# Patient Record
Sex: Male | Born: 1995 | Race: White | Hispanic: No | Marital: Single | Smoking: Never smoker
Health system: Southern US, Community
[De-identification: ages and names within clinical notes are randomized; demographics above are authoritative.]

---
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
## 2017-02-02 DIAGNOSIS — R7989 Other specified abnormal findings of blood chemistry: Secondary | ICD-10-CM | POA: Diagnosis not present

## 2017-02-02 DIAGNOSIS — R791 Abnormal coagulation profile: Secondary | ICD-10-CM | POA: Diagnosis not present

## 2017-02-02 DIAGNOSIS — R945 Abnormal results of liver function studies: Secondary | ICD-10-CM | POA: Diagnosis not present

## 2017-02-02 DIAGNOSIS — K130 Diseases of lips: Secondary | ICD-10-CM | POA: Diagnosis not present

## 2017-02-02 DIAGNOSIS — D649 Anemia, unspecified: Secondary | ICD-10-CM | POA: Diagnosis not present

## 2017-06-02 DIAGNOSIS — B349 Viral infection, unspecified: Secondary | ICD-10-CM | POA: Diagnosis not present

## 2018-06-01 DIAGNOSIS — N5089 Other specified disorders of the male genital organs: Secondary | ICD-10-CM | POA: Diagnosis not present

## 2018-06-01 DIAGNOSIS — N50819 Testicular pain, unspecified: Secondary | ICD-10-CM | POA: Diagnosis not present

## 2018-06-01 DIAGNOSIS — I861 Scrotal varices: Secondary | ICD-10-CM | POA: Diagnosis not present

## 2018-06-15 DIAGNOSIS — N509 Disorder of male genital organs, unspecified: Secondary | ICD-10-CM | POA: Diagnosis not present

## 2018-07-04 DIAGNOSIS — R1031 Right lower quadrant pain: Secondary | ICD-10-CM | POA: Diagnosis not present

## 2018-07-09 ENCOUNTER — Other Ambulatory Visit: Payer: Self-pay | Admitting: Surgery

## 2018-07-09 DIAGNOSIS — R1031 Right lower quadrant pain: Secondary | ICD-10-CM

## 2018-07-25 ENCOUNTER — Ambulatory Visit
Admission: RE | Admit: 2018-07-25 | Discharge: 2018-07-25 | Disposition: A | Discharge status: Home / Self Care | Payer: BC Managed Care – PPO | Source: Ambulatory Visit | Attending: Surgery | Admitting: Surgery

## 2018-07-25 ENCOUNTER — Telehealth: Payer: Self-pay | Admitting: Surgery

## 2018-07-25 DIAGNOSIS — M899 Disorder of bone, unspecified: Secondary | ICD-10-CM | POA: Diagnosis not present

## 2018-07-25 DIAGNOSIS — R1030 Lower abdominal pain, unspecified: Secondary | ICD-10-CM | POA: Diagnosis not present

## 2018-07-25 DIAGNOSIS — R1031 Right lower quadrant pain: Secondary | ICD-10-CM

## 2018-07-25 MED ORDER — IOPAMIDOL (ISOVUE-300) INJECTION 61%
100.0000 mL | Freq: Once | INTRAVENOUS | Status: AC | PRN
  Administered 2018-07-25: 100 mL via INTRAVENOUS

## 2018-07-25 NOTE — Telephone Encounter (Signed)
Called patient to go over CT results. No inguinal hernia. No findings to explain the pain he'd had. He reports the pain is now resolved. He is still concerned about the asymmetry of his testicles/ scrotum. I advised him to follow up with his urologist about this.

## 2018-08-24 DIAGNOSIS — N509 Disorder of male genital organs, unspecified: Secondary | ICD-10-CM | POA: Diagnosis not present

## 2019-07-05 DIAGNOSIS — N509 Disorder of male genital organs, unspecified: Secondary | ICD-10-CM | POA: Diagnosis not present

## 2020-01-06 DIAGNOSIS — B349 Viral infection, unspecified: Secondary | ICD-10-CM | POA: Diagnosis not present

## 2020-01-07 DIAGNOSIS — Z20822 Contact with and (suspected) exposure to covid-19: Secondary | ICD-10-CM | POA: Diagnosis not present

## 2020-01-07 DIAGNOSIS — Z03818 Encounter for observation for suspected exposure to other biological agents ruled out: Secondary | ICD-10-CM | POA: Diagnosis not present

## 2020-03-30 DIAGNOSIS — M6281 Muscle weakness (generalized): Secondary | ICD-10-CM | POA: Diagnosis not present

## 2020-03-30 DIAGNOSIS — M542 Cervicalgia: Secondary | ICD-10-CM | POA: Diagnosis not present

## 2020-04-22 DIAGNOSIS — M6281 Muscle weakness (generalized): Secondary | ICD-10-CM | POA: Diagnosis not present

## 2020-04-22 DIAGNOSIS — M542 Cervicalgia: Secondary | ICD-10-CM | POA: Diagnosis not present

## 2020-07-20 DIAGNOSIS — L219 Seborrheic dermatitis, unspecified: Secondary | ICD-10-CM | POA: Diagnosis not present

## 2020-07-20 DIAGNOSIS — F5101 Primary insomnia: Secondary | ICD-10-CM | POA: Diagnosis not present

## 2021-03-16 IMAGING — CT CT PELVIS WITH CONTRAST
1 series · 15 of 32 positions shown, 19 images · IV contrast (APPLIED)
Comparison: None.

CLINICAL DATA: Right groin and medial thigh pain for 2 months. Dull
testicular pain with asymmetric testicles. No acute injury, prior
relevant surgery or history of malignancy.

EXAM:
CT PELVIS WITH CONTRAST
TECHNIQUE: Multidetector CT imaging of the pelvis was performed using the
standard protocol following the bolus administration of intravenous
contrast.
CONTRAST:  100mL UGY1AX-800 IOPAMIDOL (UGY1AX-800) INJECTION 61%

[Series 2: routine pelvis w/cm · axial · 0.72mm/px · z∈[-362,-82]mm · 15 of 63 slices shown, 19 images]
[im 5/63  soft-tissue]
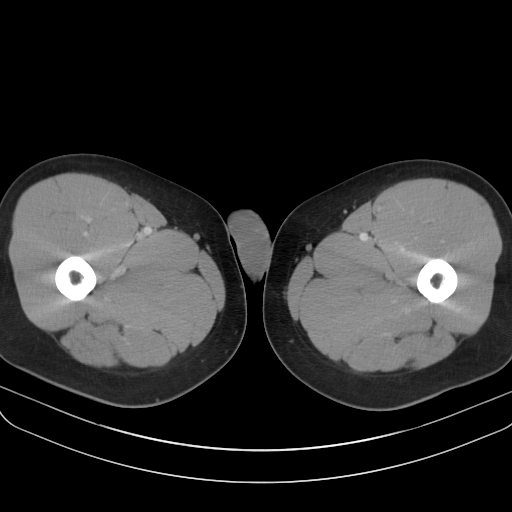
[im 5/63  bone]
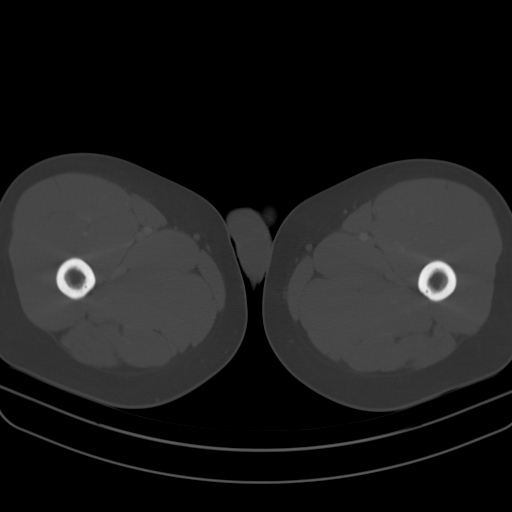
[im 9/63  soft-tissue]
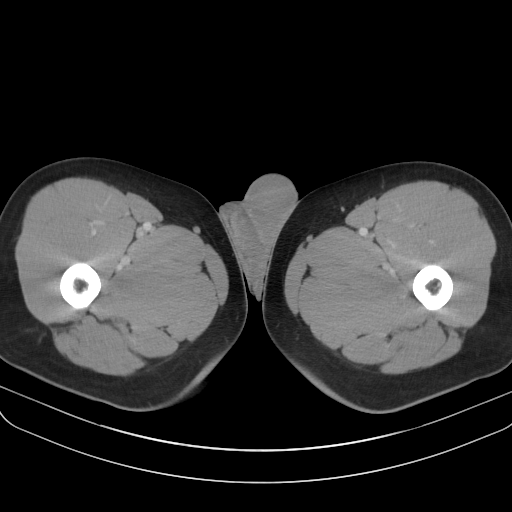
[im 13/63  soft-tissue]
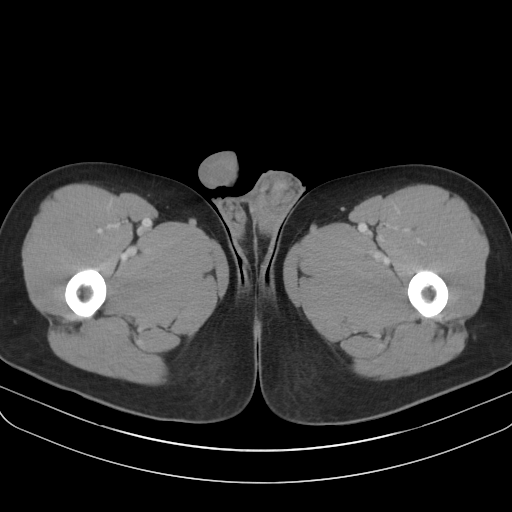
[im 19/63  soft-tissue]
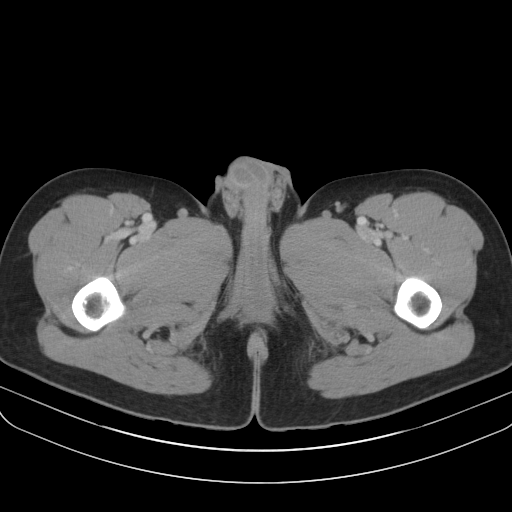
[im 23/63  soft-tissue]
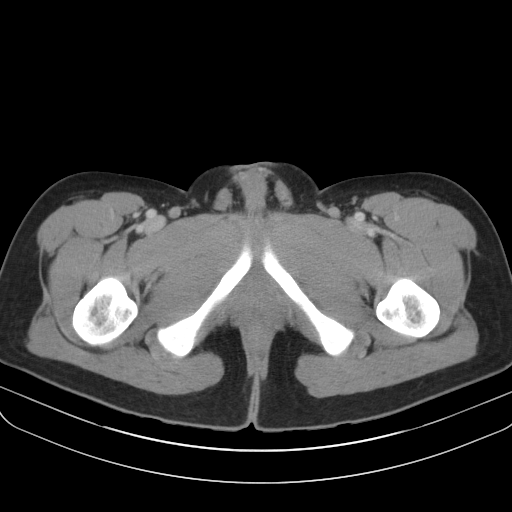
[im 27/63  soft-tissue]
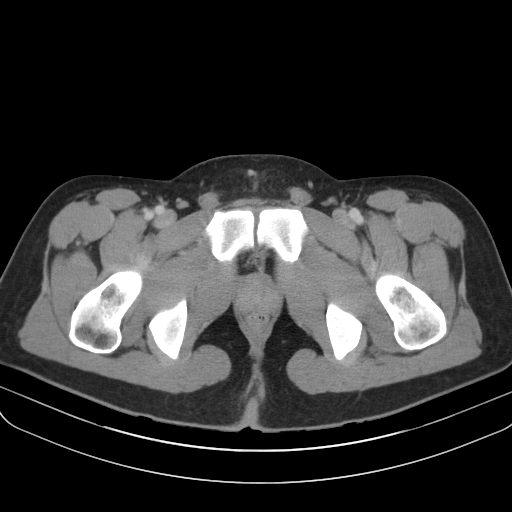
[im 33/63  soft-tissue]
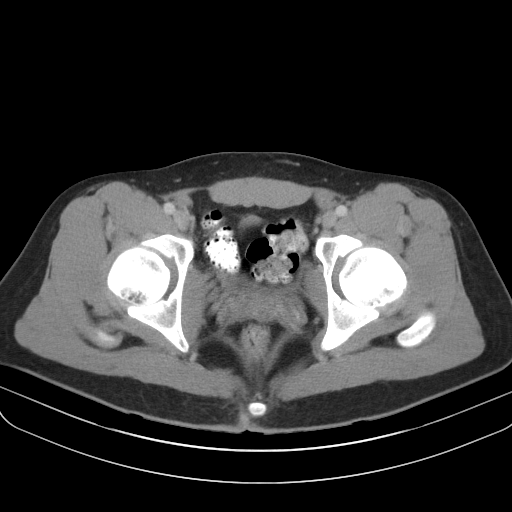
[im 37/63  soft-tissue]
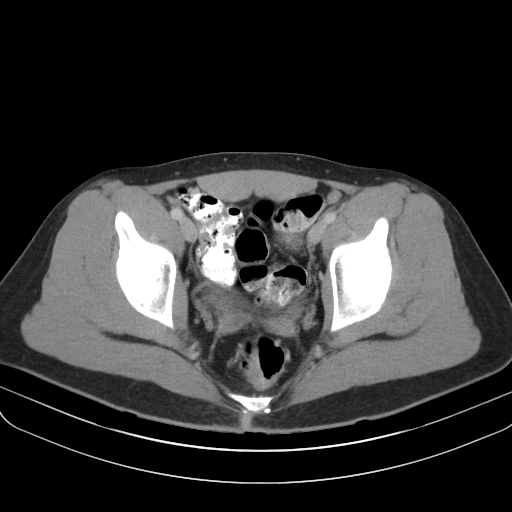
[im 41/63  soft-tissue]
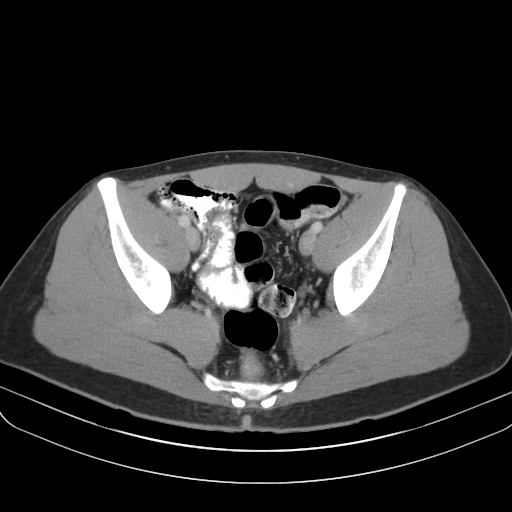
[im 41/63  bone]
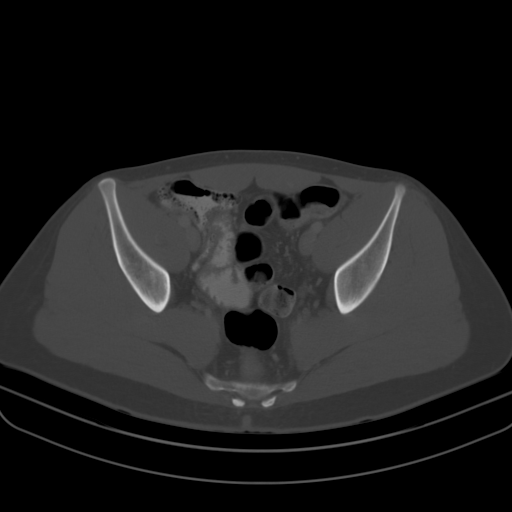
[im 45/63  soft-tissue]
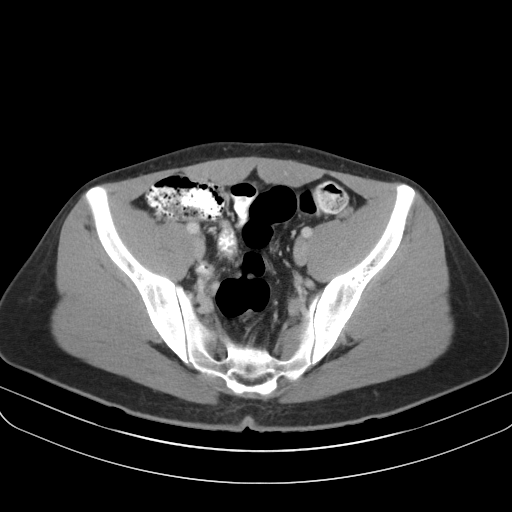
[im 51/63  soft-tissue]
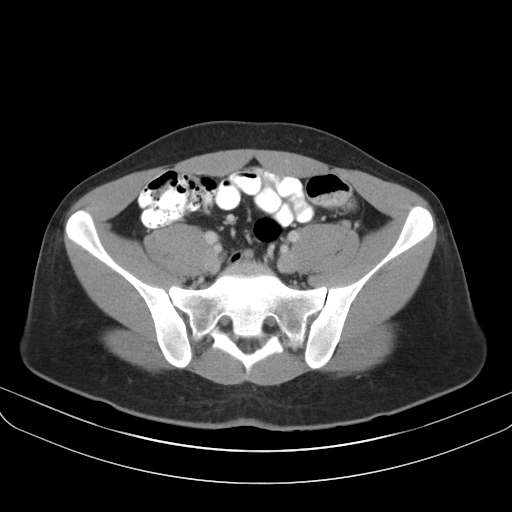
[im 55/63  soft-tissue]
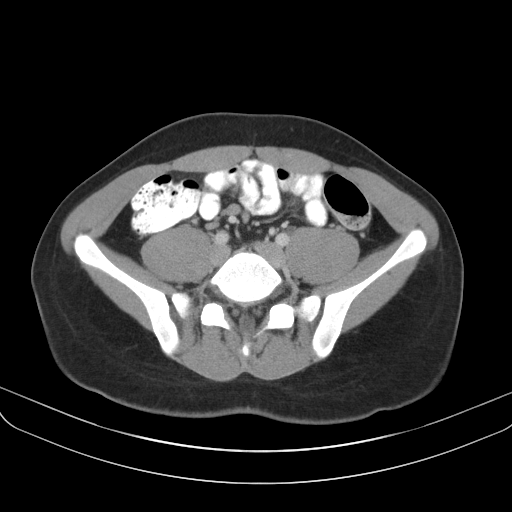
[im 55/63  lung]
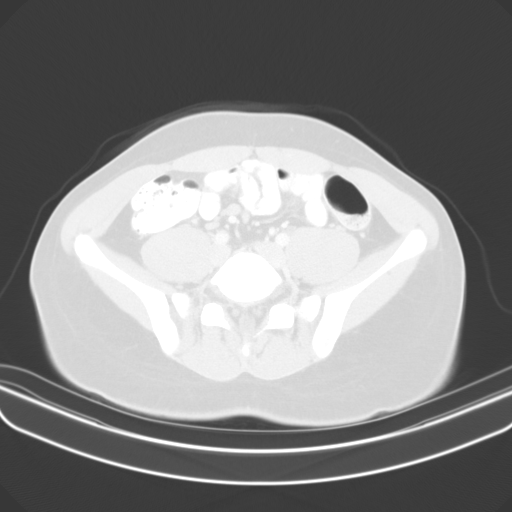
[im 57/63  lung]
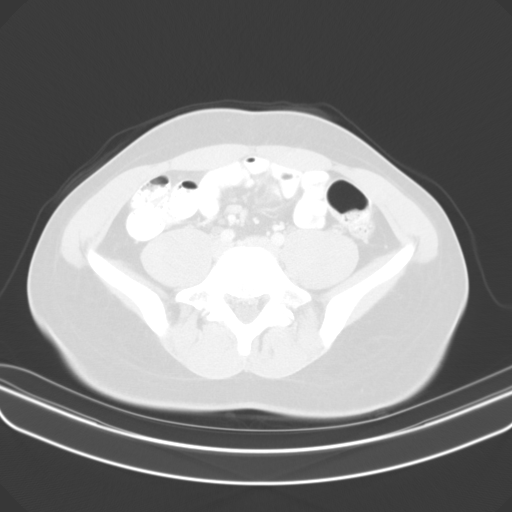
[im 59/63  soft-tissue]
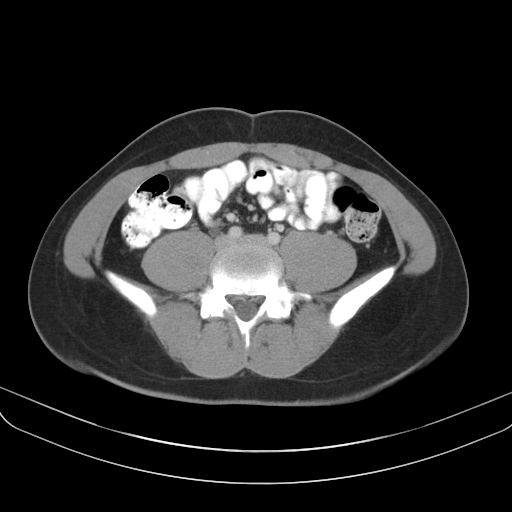
[im 59/63  lung]
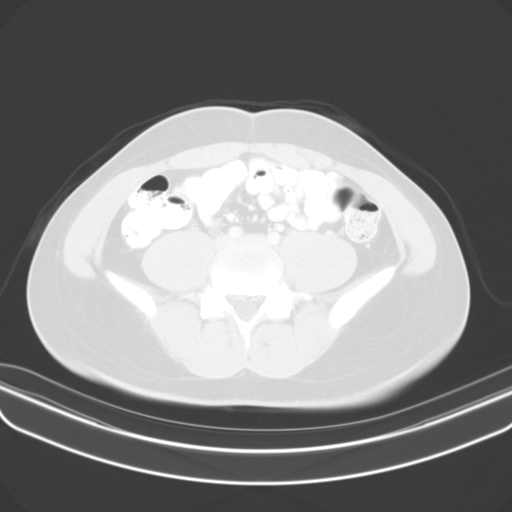
[im 61/63  lung]
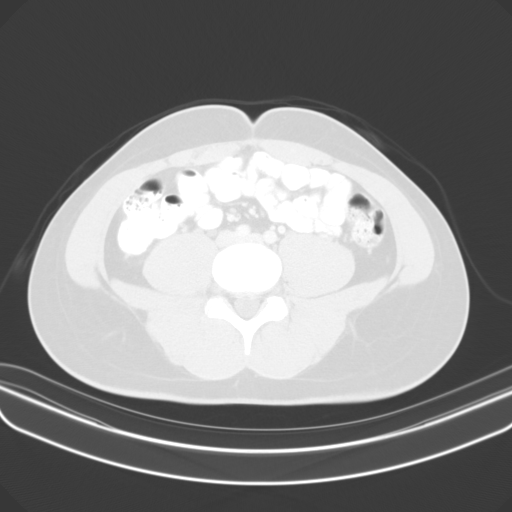

[15 of 32 positions shown; findings below may reference images not displayed]

FINDINGS: Urinary Tract: The visualized distal ureters and bladder appear
unremarkable. The bladder is nearly empty.

Bowel: No bowel wall thickening, distention or surrounding
inflammation identified within the pelvis. The appendix appears
normal.

Vascular/Lymphatic: No enlarged pelvic lymph nodes identified. No
significant vascular findings.

Reproductive: The prostate gland and seminal vesicles appear normal.
The spermatic cords and testes appear grossly symmetric.

Other: No pelvic ascites or inflammation. No evidence of inguinal
mass.

Musculoskeletal: No acute or worrisome osseous findings. The bony
pelvis and hip joints appear unremarkable. There is a
well-circumscribed sclerotic lesion in the intertrochanteric region
of the left femur measuring 16 mm on image 51/4. This has a
nonaggressive appearance and is likely an incidental chondroid
lesion.
IMPRESSION: No acute findings or explanation for the patient's symptoms.

Sclerotic lesion in the intertrochanteric region of the left femur
has a nonaggressive appearance and is likely an incidental chondroid
lesion.

## 2021-05-27 DIAGNOSIS — H16011 Central corneal ulcer, right eye: Secondary | ICD-10-CM | POA: Diagnosis not present

## 2021-05-27 DIAGNOSIS — H16001 Unspecified corneal ulcer, right eye: Secondary | ICD-10-CM | POA: Diagnosis not present

## 2021-05-28 DIAGNOSIS — H16011 Central corneal ulcer, right eye: Secondary | ICD-10-CM | POA: Diagnosis not present

## 2021-05-29 DIAGNOSIS — H16011 Central corneal ulcer, right eye: Secondary | ICD-10-CM | POA: Diagnosis not present

## 2021-05-31 DIAGNOSIS — H16011 Central corneal ulcer, right eye: Secondary | ICD-10-CM | POA: Diagnosis not present

## 2022-05-06 DIAGNOSIS — G8929 Other chronic pain: Secondary | ICD-10-CM | POA: Diagnosis not present

## 2022-05-06 DIAGNOSIS — M25512 Pain in left shoulder: Secondary | ICD-10-CM | POA: Diagnosis not present

## 2022-05-06 DIAGNOSIS — M542 Cervicalgia: Secondary | ICD-10-CM | POA: Diagnosis not present

## 2022-05-06 DIAGNOSIS — M25511 Pain in right shoulder: Secondary | ICD-10-CM | POA: Diagnosis not present

## 2022-05-10 DIAGNOSIS — M79671 Pain in right foot: Secondary | ICD-10-CM | POA: Diagnosis not present

## 2022-05-10 DIAGNOSIS — M79672 Pain in left foot: Secondary | ICD-10-CM | POA: Diagnosis not present

## 2022-05-10 DIAGNOSIS — Q6672 Congenital pes cavus, left foot: Secondary | ICD-10-CM | POA: Diagnosis not present

## 2022-05-20 DIAGNOSIS — G8929 Other chronic pain: Secondary | ICD-10-CM | POA: Diagnosis not present

## 2022-05-20 DIAGNOSIS — M542 Cervicalgia: Secondary | ICD-10-CM | POA: Diagnosis not present

## 2022-05-31 DIAGNOSIS — M79672 Pain in left foot: Secondary | ICD-10-CM | POA: Diagnosis not present

## 2022-05-31 DIAGNOSIS — M79671 Pain in right foot: Secondary | ICD-10-CM | POA: Diagnosis not present

## 2022-05-31 DIAGNOSIS — M722 Plantar fascial fibromatosis: Secondary | ICD-10-CM | POA: Diagnosis not present

## 2022-05-31 DIAGNOSIS — Q6672 Congenital pes cavus, left foot: Secondary | ICD-10-CM | POA: Diagnosis not present

## 2022-06-03 DIAGNOSIS — M542 Cervicalgia: Secondary | ICD-10-CM | POA: Diagnosis not present

## 2022-06-03 DIAGNOSIS — G8929 Other chronic pain: Secondary | ICD-10-CM | POA: Diagnosis not present

## 2022-06-15 DIAGNOSIS — G8929 Other chronic pain: Secondary | ICD-10-CM | POA: Diagnosis not present

## 2022-06-15 DIAGNOSIS — M542 Cervicalgia: Secondary | ICD-10-CM | POA: Diagnosis not present

## 2022-06-30 DIAGNOSIS — Z23 Encounter for immunization: Secondary | ICD-10-CM | POA: Diagnosis not present

## 2022-06-30 DIAGNOSIS — Z7189 Other specified counseling: Secondary | ICD-10-CM | POA: Diagnosis not present

## 2022-09-09 DIAGNOSIS — Z0184 Encounter for antibody response examination: Secondary | ICD-10-CM | POA: Diagnosis not present

## 2022-11-12 DIAGNOSIS — W19XXXA Unspecified fall, initial encounter: Secondary | ICD-10-CM | POA: Diagnosis not present

## 2022-11-12 DIAGNOSIS — S060X0A Concussion without loss of consciousness, initial encounter: Secondary | ICD-10-CM | POA: Diagnosis not present

## 2022-11-12 DIAGNOSIS — W1839XA Other fall on same level, initial encounter: Secondary | ICD-10-CM | POA: Diagnosis not present

## 2022-11-12 DIAGNOSIS — S0990XA Unspecified injury of head, initial encounter: Secondary | ICD-10-CM | POA: Diagnosis not present
